# Patient Record
Sex: Female | Born: 1935 | Race: White | Hispanic: No | Marital: Married | Smoking: Never smoker
Health system: Southern US, Community
[De-identification: ages and names within clinical notes are randomized; demographics above are authoritative.]

## PROBLEM LIST (undated history)

## (undated) DIAGNOSIS — E559 Vitamin D deficiency, unspecified: Secondary | ICD-10-CM

## (undated) DIAGNOSIS — E7800 Pure hypercholesterolemia, unspecified: Secondary | ICD-10-CM

## (undated) DIAGNOSIS — Z794 Long term (current) use of insulin: Secondary | ICD-10-CM

## (undated) DIAGNOSIS — R011 Cardiac murmur, unspecified: Secondary | ICD-10-CM

## (undated) DIAGNOSIS — E785 Hyperlipidemia, unspecified: Secondary | ICD-10-CM

## (undated) DIAGNOSIS — Z8679 Personal history of other diseases of the circulatory system: Secondary | ICD-10-CM

## (undated) DIAGNOSIS — E119 Type 2 diabetes mellitus without complications: Secondary | ICD-10-CM

## (undated) DIAGNOSIS — E538 Deficiency of other specified B group vitamins: Secondary | ICD-10-CM

## (undated) DIAGNOSIS — I498 Other specified cardiac arrhythmias: Principal | ICD-10-CM

## (undated) DIAGNOSIS — I10 Essential (primary) hypertension: Secondary | ICD-10-CM

## (undated) HISTORY — DX: Vitamin D deficiency, unspecified: E55.9

## (undated) HISTORY — DX: Personal history of other diseases of the circulatory system: Z86.79

## (undated) HISTORY — DX: Deficiency of other specified B group vitamins: E53.8

## (undated) HISTORY — DX: Long term (current) use of insulin: Z79.4

## (undated) HISTORY — DX: Hyperlipidemia, unspecified: E78.5

## (undated) HISTORY — DX: Type 2 diabetes mellitus without complications: E11.9

## (undated) HISTORY — DX: Other specified cardiac arrhythmias: I49.8

## (undated) HISTORY — PX: ABLATION: SHX5711

---

## 1998-05-29 ENCOUNTER — Ambulatory Visit (HOSPITAL_COMMUNITY): Admission: RE | Admit: 1998-05-29 | Discharge: 1998-05-29 | Payer: Self-pay | Admitting: Obstetrics and Gynecology

## 1998-05-29 ENCOUNTER — Encounter: Payer: Self-pay | Admitting: Obstetrics and Gynecology

## 1998-08-18 ENCOUNTER — Other Ambulatory Visit: Admission: RE | Admit: 1998-08-18 | Discharge: 1998-08-18 | Payer: Self-pay | Admitting: Obstetrics and Gynecology

## 1999-06-22 ENCOUNTER — Ambulatory Visit (HOSPITAL_COMMUNITY): Admission: RE | Admit: 1999-06-22 | Discharge: 1999-06-22 | Payer: Self-pay | Admitting: Obstetrics and Gynecology

## 1999-06-22 ENCOUNTER — Encounter: Payer: Self-pay | Admitting: Obstetrics and Gynecology

## 1999-08-28 ENCOUNTER — Other Ambulatory Visit: Admission: RE | Admit: 1999-08-28 | Discharge: 1999-08-28 | Payer: Self-pay | Admitting: Obstetrics and Gynecology

## 2000-07-30 ENCOUNTER — Ambulatory Visit (HOSPITAL_COMMUNITY): Admission: RE | Admit: 2000-07-30 | Discharge: 2000-07-30 | Payer: Self-pay | Admitting: Obstetrics and Gynecology

## 2000-07-30 ENCOUNTER — Encounter: Payer: Self-pay | Admitting: Obstetrics and Gynecology

## 2000-08-04 ENCOUNTER — Encounter: Payer: Self-pay | Admitting: Obstetrics and Gynecology

## 2000-08-04 ENCOUNTER — Encounter: Admission: RE | Admit: 2000-08-04 | Discharge: 2000-08-04 | Payer: Self-pay | Admitting: Obstetrics and Gynecology

## 2000-09-01 ENCOUNTER — Other Ambulatory Visit: Admission: RE | Admit: 2000-09-01 | Discharge: 2000-09-01 | Payer: Self-pay | Admitting: Obstetrics and Gynecology

## 2001-09-21 ENCOUNTER — Other Ambulatory Visit: Admission: RE | Admit: 2001-09-21 | Discharge: 2001-09-21 | Payer: Self-pay | Admitting: Obstetrics and Gynecology

## 2001-09-24 ENCOUNTER — Ambulatory Visit (HOSPITAL_COMMUNITY): Admission: RE | Admit: 2001-09-24 | Discharge: 2001-09-24 | Payer: Self-pay | Admitting: Obstetrics and Gynecology

## 2001-09-24 ENCOUNTER — Encounter: Payer: Self-pay | Admitting: Obstetrics and Gynecology

## 2001-09-30 ENCOUNTER — Encounter: Payer: Self-pay | Admitting: Obstetrics and Gynecology

## 2001-09-30 ENCOUNTER — Encounter: Admission: RE | Admit: 2001-09-30 | Discharge: 2001-09-30 | Payer: Self-pay | Admitting: Obstetrics and Gynecology

## 2003-06-22 ENCOUNTER — Ambulatory Visit (HOSPITAL_COMMUNITY): Admission: RE | Admit: 2003-06-22 | Discharge: 2003-06-22 | Payer: Self-pay | Admitting: Obstetrics and Gynecology

## 2010-06-17 ENCOUNTER — Encounter: Payer: Self-pay | Admitting: Obstetrics and Gynecology

## 2013-03-26 LAB — VITAMIN D 25 HYDROXY (VIT D DEFICIENCY, FRACTURES): Vit D, 25-Hydroxy: 41.4

## 2013-03-26 LAB — BASIC METABOLIC PANEL
BUN: 19 mg/dL (ref 4–21)
CREATININE: 0.6 mg/dL (ref 0.5–1.1)
Potassium: 4.2 mmol/L (ref 3.4–5.3)
Sodium: 138 mmol/L (ref 137–147)

## 2013-03-26 LAB — HEPATIC FUNCTION PANEL
ALT: 14 U/L (ref 7–35)
AST: 17 U/L (ref 13–35)

## 2013-03-26 LAB — HEMOGLOBIN A1C: Hemoglobin A1C: 7.2

## 2014-01-18 ENCOUNTER — Emergency Department (HOSPITAL_BASED_OUTPATIENT_CLINIC_OR_DEPARTMENT_OTHER): Payer: Medicare Other

## 2014-01-18 ENCOUNTER — Emergency Department (HOSPITAL_BASED_OUTPATIENT_CLINIC_OR_DEPARTMENT_OTHER)
Admission: EM | Admit: 2014-01-18 | Discharge: 2014-01-18 | Disposition: A | Discharge status: Home / Self Care | Payer: Medicare Other | Attending: Emergency Medicine | Admitting: Emergency Medicine

## 2014-01-18 ENCOUNTER — Encounter (HOSPITAL_BASED_OUTPATIENT_CLINIC_OR_DEPARTMENT_OTHER): Payer: Self-pay | Admitting: Emergency Medicine

## 2014-01-18 DIAGNOSIS — I499 Cardiac arrhythmia, unspecified: Secondary | ICD-10-CM | POA: Insufficient documentation

## 2014-01-18 DIAGNOSIS — K5900 Constipation, unspecified: Secondary | ICD-10-CM | POA: Diagnosis not present

## 2014-01-18 DIAGNOSIS — E119 Type 2 diabetes mellitus without complications: Secondary | ICD-10-CM | POA: Insufficient documentation

## 2014-01-18 DIAGNOSIS — Z79899 Other long term (current) drug therapy: Secondary | ICD-10-CM | POA: Insufficient documentation

## 2014-01-18 DIAGNOSIS — I10 Essential (primary) hypertension: Secondary | ICD-10-CM | POA: Diagnosis not present

## 2014-01-18 DIAGNOSIS — E7800 Pure hypercholesterolemia, unspecified: Secondary | ICD-10-CM | POA: Diagnosis not present

## 2014-01-18 DIAGNOSIS — M549 Dorsalgia, unspecified: Secondary | ICD-10-CM | POA: Insufficient documentation

## 2014-01-18 DIAGNOSIS — R002 Palpitations: Secondary | ICD-10-CM | POA: Diagnosis not present

## 2014-01-18 DIAGNOSIS — R011 Cardiac murmur, unspecified: Secondary | ICD-10-CM | POA: Insufficient documentation

## 2014-01-18 HISTORY — DX: Cardiac murmur, unspecified: R01.1

## 2014-01-18 HISTORY — DX: Essential (primary) hypertension: I10

## 2014-01-18 HISTORY — DX: Type 2 diabetes mellitus without complications: E11.9

## 2014-01-18 HISTORY — DX: Pure hypercholesterolemia, unspecified: E78.00

## 2014-01-18 LAB — BASIC METABOLIC PANEL
Anion gap: 13 (ref 5–15)
BUN: 18 mg/dL (ref 6–23)
CALCIUM: 10.9 mg/dL — AB (ref 8.4–10.5)
CO2: 25 meq/L (ref 19–32)
Chloride: 101 mEq/L (ref 96–112)
Creatinine, Ser: 0.8 mg/dL (ref 0.50–1.10)
GFR calc Af Amer: 75 mL/min — ABNORMAL LOW (ref >90)
GFR, EST NON AFRICAN AMERICAN: 64 mL/min — AB (ref >90)
GLUCOSE: 228 mg/dL — AB (ref 70–99)
POTASSIUM: 4 meq/L (ref 3.7–5.3)
SODIUM: 139 meq/L (ref 137–147)

## 2014-01-18 LAB — CBC WITH DIFFERENTIAL/PLATELET
BASOS PCT: 0 % (ref 0–1)
Basophils Absolute: 0 10*3/uL (ref 0.0–0.1)
Eosinophils Absolute: 0.4 10*3/uL (ref 0.0–0.7)
Eosinophils Relative: 7 % — ABNORMAL HIGH (ref 0–5)
HCT: 38 % (ref 36.0–46.0)
HEMOGLOBIN: 13 g/dL (ref 12.0–15.0)
LYMPHS ABS: 1.2 10*3/uL (ref 0.7–4.0)
Lymphocytes Relative: 23 % (ref 12–46)
MCH: 31.6 pg (ref 26.0–34.0)
MCHC: 34.2 g/dL (ref 30.0–36.0)
MCV: 92.2 fL (ref 78.0–100.0)
MONO ABS: 0.4 10*3/uL (ref 0.1–1.0)
MONOS PCT: 8 % (ref 3–12)
Neutro Abs: 3.3 10*3/uL (ref 1.7–7.7)
Neutrophils Relative %: 62 % (ref 43–77)
PLATELETS: 154 10*3/uL (ref 150–400)
RBC: 4.12 MIL/uL (ref 3.87–5.11)
RDW: 12.3 % (ref 11.5–15.5)
WBC: 5.3 10*3/uL (ref 4.0–10.5)

## 2014-01-18 LAB — TROPONIN I

## 2014-01-18 NOTE — ED Notes (Signed)
Pt states hx at dinner was feeling her pulse and noticed a irregular heart beat. Pt denies pain or any other complaints.

## 2014-01-18 NOTE — ED Provider Notes (Signed)
CSN: [id number]     Arrival date & time 01/18/14  1920 History  This chart was scribed for Vanetta Mulders, MD by Roxy Cedar, ED Scribe. This patient was seen in room MH11/MH11 and the patient's care was started at 8:21 PM.   Chief Complaint  Patient presents with  . irregular heart beat    Patient is a 80 y.o. female presenting with palpitations. The history is provided by the patient. No language interpreter was used.  Palpitations Palpitations quality:  Irregular (heart murmurs) Chronicity:  New Context: anxiety   Context comment:  Stress Associated symptoms: back pain   Associated symptoms: no chest pain, no cough, no nausea, no shortness of breath and no vomiting     HPI Comments: Jennifer Serrano is a 80 y.o. female with a history of a heart murmur who presents to the Emergency Department complaining of an irregular heart beat and suspicion of irregularities. Patient is normally seen by her PCP in [place]. Patient states she noticed that her heart murmur "usually occur at least 1 minute apart, but for have recently come closer together than 1 minute". She states that she has been more stressed than usual while taking care of multiple family members. Patient denies associated chest pain, shortness of breath or palpitations.   Past Medical History  Diagnosis Date  . Heart murmur   . Diabetes mellitus without complication   . Hypertension   . High cholesterol    History reviewed. No pertinent past surgical history. No family history on file. History  Substance Use Topics  . Smoking status: Never Smoker   . Smokeless tobacco: Not on file  . Alcohol Use: No   OB History   Grav Para Term Preterm Abortions TAB SAB Ect Mult Living                 Review of Systems  Constitutional: Negative for fever and chills.  HENT: Negative for rhinorrhea and sore throat.   Eyes: Negative for visual disturbance.  Respiratory: Negative for cough and shortness of breath.    Cardiovascular: Positive for palpitations. Negative for chest pain and leg swelling.  Gastrointestinal: Positive for constipation. Negative for nausea, vomiting, abdominal pain and diarrhea.  Genitourinary: Negative for dysuria.  Musculoskeletal: Positive for back pain.  Skin: Negative for rash.  Neurological: Negative for headaches.  Hematological: Does not bruise/bleed easily.  Psychiatric/Behavioral: Negative for confusion.   Allergies  Sulfa antibiotics  Home Medications   Prior to Admission medications   Medication Sig Start Date End Date Taking? Authorizing Provider  amLODipine (NORVASC) 5 MG tablet Take 5 mg by mouth daily.   Yes Historical Provider, MD  glipiZIDE (GLUCOTROL) 10 MG tablet Take 10 mg by mouth daily before breakfast.   Yes Historical Provider, MD  metFORMIN (GLUCOPHAGE) 1000 MG tablet Take 1,000 mg by mouth 2 (two) times daily with a meal.   Yes Historical Provider, MD  metoprolol tartrate (LOPRESSOR) 25 MG tablet Take 25 mg by mouth 2 (two) times daily.   Yes Historical Provider, MD  rosuvastatin (CRESTOR) 5 MG tablet Take 5 mg by mouth daily.   Yes Historical Provider, MD   Triage Vitals: BP 171/80  Pulse 108  Temp(Src) 98.1 F (36.7 C) (Oral)  Resp 20  Ht  (1.626 m)  Wt 163 lb (73.936 kg)  BMI 27.97 kg/m2  SpO2 98% Physical Exam  Nursing note and vitals reviewed. Constitutional: She is oriented to person, place, and time. She appears well-developed and well-nourished.  HENT:  Mouth/Throat: Oropharynx is clear and moist.  Eyes: Conjunctivae and EOM are normal. Pupils are equal, round, and reactive to light. No scleral icterus.  Neck: Normal range of motion.  Cardiovascular: Normal rate, regular rhythm and normal heart sounds.   Pulmonary/Chest: Effort normal and breath sounds normal. She has no wheezes.  Abdominal: Soft. Bowel sounds are normal. There is no tenderness.  Musculoskeletal: Normal range of motion. She exhibits no edema.   Neurological: She is alert and oriented to person, place, and time. No cranial nerve deficit. She exhibits normal muscle tone. Coordination normal.    ED Course  Procedures (including critical care time)  DIAGNOSTIC STUDIES: Oxygen Saturation is 98% on RA, normal by my interpretation.    COORDINATION OF CARE: 8:26 PM- Discussed plans to order diagnostic lab work and imaging. Pt advised of plan for treatment and pt agrees.  Labs Review Labs Reviewed  CBC WITH DIFFERENTIAL - Abnormal; Notable for the following:    Eosinophils Relative 7 (*)    All other components within normal limits  BASIC METABOLIC PANEL - Abnormal; Notable for the following:    Glucose, Bld 228 (*)    Calcium 10.9 (*)    GFR calc non Af Amer 64 (*)    GFR calc Af Amer 75 (*)    All other components within normal limits  TROPONIN I   Results for orders placed during the hospital encounter of 01/18/14  CBC WITH DIFFERENTIAL      Result Value Ref Range   WBC 5.3  4.0 - 10.5 K/uL   RBC 4.12  3.87 - 5.11 MIL/uL   Hemoglobin 13.0  12.0 - 15.0 g/dL   HCT 40.9  81.1 - 91.4 %   MCV 92.2  78.0 - 100.0 fL   MCH 31.6  26.0 - 34.0 pg   MCHC 34.2  30.0 - 36.0 g/dL   RDW 78.2  95.6 - 21.3 %   Platelets 154  150 - 400 K/uL   Neutrophils Relative % 62  43 - 77 %   Lymphocytes Relative 23  12 - 46 %   Monocytes Relative 8  3 - 12 %   Eosinophils Relative 7 (*) 0 - 5 %   Basophils Relative 0  0 - 1 %   Neutro Abs 3.3  1.7 - 7.7 K/uL   Lymphs Abs 1.2  0.7 - 4.0 K/uL   Monocytes Absolute 0.4  0.1 - 1.0 K/uL   Eosinophils Absolute 0.4  0.0 - 0.7 K/uL   Basophils Absolute 0.0  0.0 - 0.1 K/uL  BASIC METABOLIC PANEL      Result Value Ref Range   Sodium 139  137 - 147 mEq/L   Potassium 4.0  3.7 - 5.3 mEq/L   Chloride 101  96 - 112 mEq/L   CO2 25  19 - 32 mEq/L   Glucose, Bld 228 (*) 70 - 99 mg/dL   BUN 18  6 - 23 mg/dL   Creatinine, Ser 0.86  0.50 - 1.10 mg/dL   Calcium 57.8 (*) 8.4 - 10.5 mg/dL   GFR calc non Af  Amer 64 (*) >90 mL/min   GFR calc Af Amer 75 (*) >90 mL/min   Anion gap 13  5 - 15  TROPONIN I      Result Value Ref Range   Troponin I <0.30  <0.30 ng/mL   Dg Chest 2 View  01/18/2014   CLINICAL DATA:  Palpitations at about 6 p.m. tonight.  EXAM: CHEST  2 VIEW  COMPARISON:  None.  FINDINGS: Pulmonary hyperinflation. The heart size and mediastinal contours are within normal limits. Both lungs are clear. The visualized skeletal structures are unremarkable. Calcification of aorta.  IMPRESSION: No active cardiopulmonary disease.   Electronically Signed   By: Burman Nieves M.D.   On: 01/18/2014 21:20     Imaging Review Dg Chest 2 View  01/18/2014   CLINICAL DATA:  Palpitations at about 6 p.m. tonight.  EXAM: CHEST  2 VIEW  COMPARISON:  None.  FINDINGS: Pulmonary hyperinflation. The heart size and mediastinal contours are within normal limits. Both lungs are clear. The visualized skeletal structures are unremarkable. Calcification of aorta.  IMPRESSION: No active cardiopulmonary disease.   Electronically Signed   By: Burman Nieves M.D.   On: 01/18/2014 21:20     EKG Interpretation   Date/Time:  Tuesday January 18 2014 19:56:46 EDT Ventricular Rate:  94 PR Interval:  174 QRS Duration: 84 QT Interval:  342 QTC Calculation: 427 R Axis:   53 Text Interpretation:  Sinus rhythm with Premature atrial complexes  Nonspecific ST and T wave abnormality Abnormal ECG No previous ECGs  available Confirmed by Roselyn Doby  MD, [place] [phone]) on 01/18/2014 8:17:44  PM      MDM   Final diagnoses:  Heart palpitations    The patient noted an irregular heartbeat escaping of the beat by checking her pulse. Did not have any sensation of palpitations no chest pain no shortness of breath. Workup without any specific findings. EKG did show some premature atrial contractions no abnormal ventricular beats. Troponin negative chest x-ray negative for pneumonia pulmonary edema or pneumothorax. No significant  which are light abnormalities. Patient blood sugar was elevated she has a history of diabetes. No significant anemia or leukocytosis.  I personally performed the services described in this documentation, which was scribed in my presence. The recorded information has been reviewed and is accurate.      Vanetta Mulders, MD 01/18/14 2129

## 2014-01-18 NOTE — ED Notes (Signed)
Patient transported to X-ray via stretcher per tech. 

## 2014-01-18 NOTE — Discharge Instructions (Signed)
Workup here tonight without any significant abnormalities. Return for any newer worse symptoms.

## 2015-09-26 LAB — LIPID PANEL
Cholesterol: 127 mg/dL (ref 0–200)
HDL: 46 mg/dL (ref 35–70)
LDL Cholesterol: 57 mg/dL
TRIGLYCERIDES: 123 mg/dL (ref 40–160)

## 2015-09-26 LAB — HEMOGLOBIN A1C: Hemoglobin A1C: 6.9

## 2015-09-26 LAB — BASIC METABOLIC PANEL
BUN: 21 mg/dL (ref 4–21)
CREATININE: 0.7 mg/dL (ref 0.5–1.1)
POTASSIUM: 4.6 mmol/L (ref 3.4–5.3)
Sodium: 139 mmol/L (ref 137–147)

## 2015-09-26 LAB — HEPATIC FUNCTION PANEL
ALT: 14 U/L (ref 7–35)
AST: 22 U/L (ref 13–35)

## 2016-03-25 IMAGING — CR DG CHEST 2V
2 series · 2 of 2 positions shown · non-contrast
Comparison: None.

CLINICAL DATA: Palpitations at about 6 p.m. tonight.

EXAM:
CHEST  2 VIEW

[w chest pa]
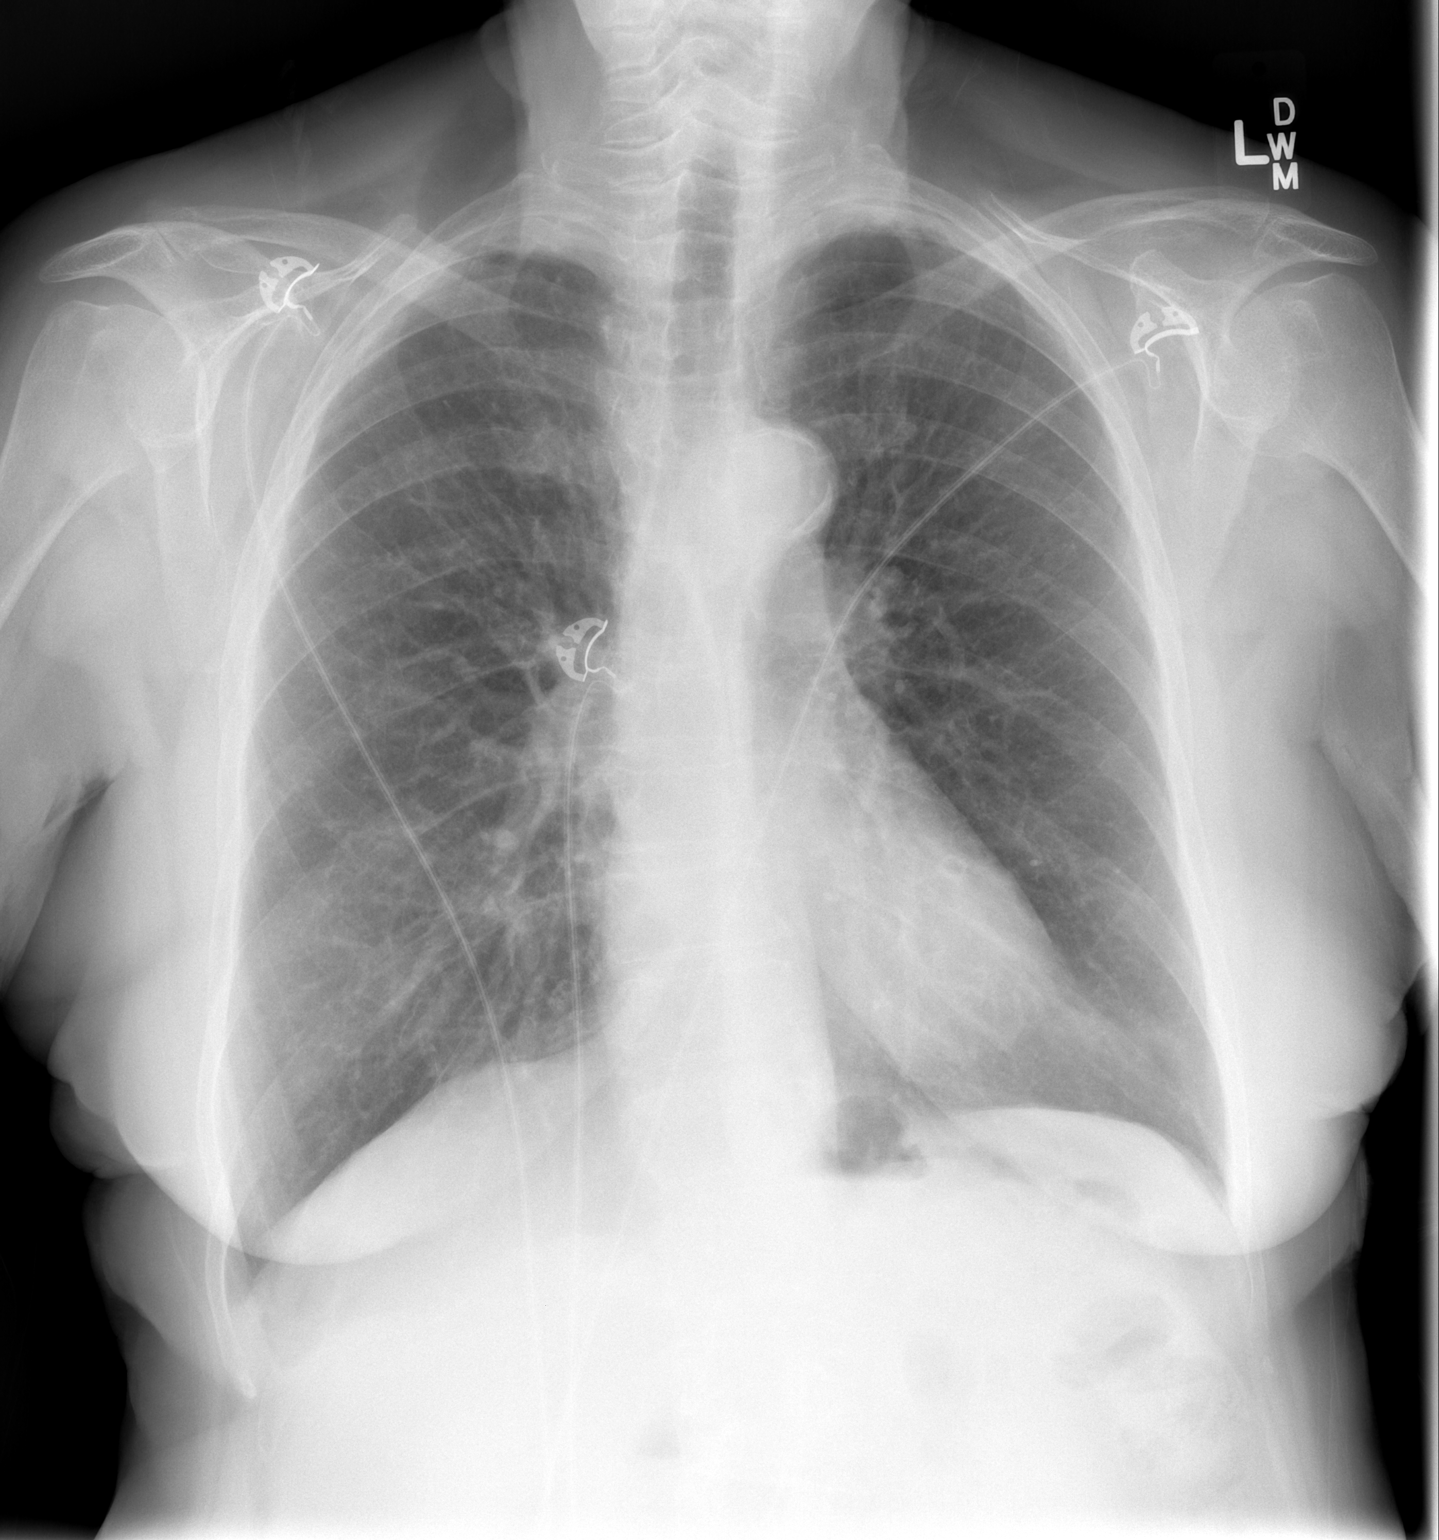

[w chest lat]
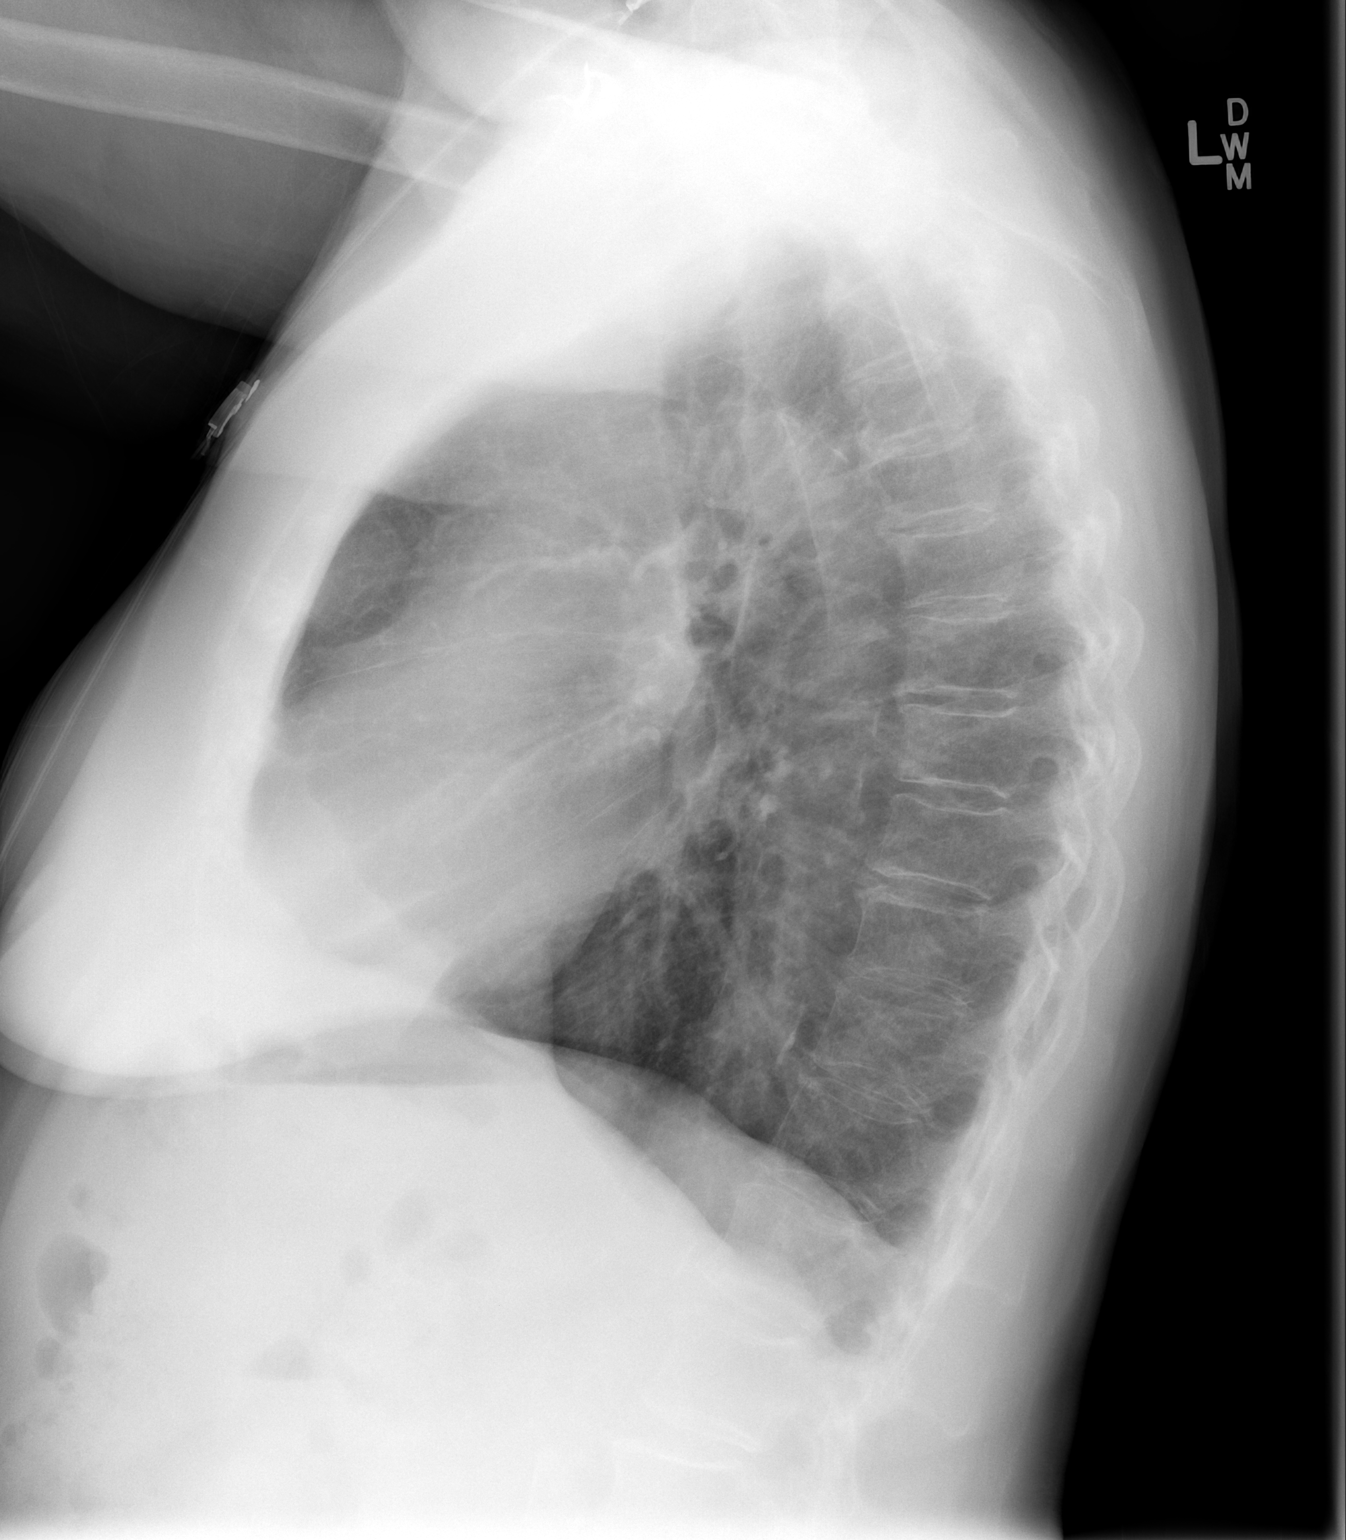

[2 of 2 positions shown; findings below may reference images not displayed]

FINDINGS: Pulmonary hyperinflation. The heart size and mediastinal contours
are within normal limits. Both lungs are clear. The visualized
skeletal structures are unremarkable. Calcification of aorta.
IMPRESSION: No active cardiopulmonary disease.

## 2016-06-10 LAB — LIPID PANEL
Cholesterol: 145 mg/dL (ref 0–200)
HDL: 54 mg/dL (ref 35–70)
LDL CALC: 68 mg/dL
TRIGLYCERIDES: 113 mg/dL (ref 40–160)

## 2016-06-10 LAB — TSH: TSH: 3.8 u[IU]/mL (ref 0.41–5.90)

## 2016-08-12 LAB — CBC AND DIFFERENTIAL
HCT: 40 % (ref 36–46)
HEMOGLOBIN: 13.5 g/dL (ref 12.0–16.0)
PLATELETS: 208 10*3/uL (ref 150–399)
WBC: 10.4 10^3/mL

## 2016-08-12 LAB — BASIC METABOLIC PANEL
BUN: 21 mg/dL (ref 4–21)
Creatinine: 1 mg/dL (ref 0.5–1.1)

## 2016-08-12 LAB — HEPATIC FUNCTION PANEL
ALT: 39 U/L — AB (ref 7–35)
AST: 36 U/L — AB (ref 13–35)

## 2016-10-08 ENCOUNTER — Telehealth: Payer: Self-pay | Admitting: Behavioral Health

## 2016-10-08 NOTE — Telephone Encounter (Signed)
Unable to reach patient/caregiver at time of Pre-Visit Call. Left message for patient/caregiver to return call when available.

## 2016-10-09 ENCOUNTER — Ambulatory Visit (INDEPENDENT_AMBULATORY_CARE_PROVIDER_SITE_OTHER): Payer: Medicare Other | Admitting: Family Medicine

## 2016-10-09 ENCOUNTER — Telehealth: Payer: Self-pay | Admitting: Family Medicine

## 2016-10-09 ENCOUNTER — Encounter: Payer: Self-pay | Admitting: Family Medicine

## 2016-10-09 VITALS — BP 110/72 | HR 76 | Temp 98.0°F | Ht 64.0 in | Wt 171.2 lb

## 2016-10-09 DIAGNOSIS — Z794 Long term (current) use of insulin: Secondary | ICD-10-CM

## 2016-10-09 DIAGNOSIS — Z5181 Encounter for therapeutic drug level monitoring: Secondary | ICD-10-CM | POA: Diagnosis not present

## 2016-10-09 DIAGNOSIS — E119 Type 2 diabetes mellitus without complications: Secondary | ICD-10-CM | POA: Diagnosis not present

## 2016-10-09 DIAGNOSIS — Z8679 Personal history of other diseases of the circulatory system: Secondary | ICD-10-CM

## 2016-10-09 DIAGNOSIS — R413 Other amnesia: Secondary | ICD-10-CM

## 2016-10-09 HISTORY — DX: Personal history of other diseases of the circulatory system: Z86.79

## 2016-10-09 HISTORY — DX: Type 2 diabetes mellitus without complications: E11.9

## 2016-10-09 LAB — COMPREHENSIVE METABOLIC PANEL
ALBUMIN: 4 g/dL (ref 3.5–5.2)
ALK PHOS: 56 U/L (ref 39–117)
ALT: 45 U/L — ABNORMAL HIGH (ref 0–35)
AST: 30 U/L (ref 0–37)
BILIRUBIN TOTAL: 0.4 mg/dL (ref 0.2–1.2)
BUN: 23 mg/dL (ref 6–23)
CO2: 30 mEq/L (ref 19–32)
Calcium: 10.7 mg/dL — ABNORMAL HIGH (ref 8.4–10.5)
Chloride: 106 mEq/L (ref 96–112)
Creatinine, Ser: 1.04 mg/dL (ref 0.40–1.20)
GFR: 52.85 mL/min — AB (ref >60.00)
Glucose, Bld: 225 mg/dL — ABNORMAL HIGH (ref 70–99)
Potassium: 5.1 mEq/L (ref 3.5–5.1)
Sodium: 139 mEq/L (ref 135–145)
TOTAL PROTEIN: 7 g/dL (ref 6.0–8.3)

## 2016-10-09 LAB — CBC
HEMATOCRIT: 37.5 % (ref 36.0–46.0)
Hemoglobin: 12.5 g/dL (ref 12.0–15.0)
MCHC: 33.5 g/dL (ref 30.0–36.0)
MCV: 94.3 fl (ref 78.0–100.0)
PLATELETS: 224 10*3/uL (ref 150.0–400.0)
RBC: 3.97 Mil/uL (ref 3.87–5.11)
RDW: 14.1 % (ref 11.5–15.5)
WBC: 4.9 10*3/uL (ref 4.0–10.5)

## 2016-10-09 LAB — HEMOGLOBIN A1C: Hgb A1c MFr Bld: 8.7 % — ABNORMAL HIGH (ref 4.6–6.5)

## 2016-10-09 NOTE — Progress Notes (Addendum)
[facility] [place] [address], [address] [place], [place] [address] [phone] [phone]  Date:  10/09/2016   Name:  Jennifer Serrano   DOB:  07-17-1923   MRN:  [id number]  PCP:  Pearline Cablesopland, Margert Edsall C, MD    Chief Complaint: Establish Care (Pt here to est care. )   History of Present Illness:  Jennifer Serrano is a 83 y.o. very pleasant female patient who presents with the following:  Here today as a new patient with her daughter  She recently moved back to this area from [place]   She needs to re-establish care with a local physicial  She has a history of DM and HTN.   She is on lantus 10 BID and a sliding scale of humalog with meals.  Her most recent A1c was about 6.5 per her recollection.   SSI as follows 150 -200- 2u 201- 249  -4u 250- 300- 6u 301-350- 8u Over 350- 10u She is living with her daughter and SIL- she plans to visit her other family in [place] for about a one month coming up soon.   She did secretarial work as a younger person . She has 2 daughters, Jennifer HarryShelly (who is here today) and Jennifer [place]aganiniudrey in [place]- she has 4 grandchildren.   She is originally from [place].    Her husband passed away in 2005.   She has a history of atrial fib first noted in October of 17 - she had an ablation done last fall.  She is on eliquis now.   She is not aware of any palpitations and seems to be in sinus rhythm at this time.    Her daughter notes some issues with her short term memory. She confides that the long term plan will be to [facility] [place] [facility] a skilled facility they have an FL2 form for me to complete today They bring in a document listing reasons she needs skilled care including  - trouble with following diabetic diet and using her insulin, leaving her medications behind, losing things. Bladder incont, needs a cane or walker, unable to bathe unassisted Patient Active Problem List   Diagnosis Date Noted  . Controlled type 2 diabetes mellitus  without complication, with long-term current use of insulin (HCC) 10/09/2016  . History of atrial fibrillation 10/09/2016    Past Medical History:  Diagnosis Date  . Diabetes mellitus without complication (HCC)   . Heart murmur   . High cholesterol   . Hypertension     Past Surgical History:  Procedure Laterality Date  . ABLATION      Social History  Substance Use Topics  . Smoking status: Never Smoker  . Smokeless tobacco: Not on file  . Alcohol use No    Family History  Problem Relation Age of Onset  . Breast cancer Other     Allergies  Allergen Reactions  . Sulfa Antibiotics     Medication list has been reviewed and updated.  Current Outpatient Prescriptions on File Prior to Visit  Medication Sig Dispense Refill  . glipiZIDE (GLUCOTROL) 10 MG tablet Take 10 mg by mouth 2 (two) times daily before a meal.     . metoprolol tartrate (LOPRESSOR) 25 MG tablet Take 25 mg by mouth 2 (two) times daily.    . rosuvastatin (CRESTOR) 5 MG tablet Take 5 mg by mouth daily.     No current facility-administered medications on file prior to visit.  Review of Systems:  As per HPI- otherwise negative. No fever, chills, CP, SOB, nausea, vomiting or rash   Physical Examination: Vitals:   10/09/16 1330  BP: 110/72  Pulse: 76  Temp: 98 F (36.7 C)   Vitals:   10/09/16 1330  Weight: 171 lb 3.2 oz (77.7 kg)  Height: 5\' 4"  (1.626 m)   Body mass index is 29.39 kg/m. Ideal Body Weight: Weight in (lb) to have BMI = 25: 145.3  GEN: WDWN, NAD, Non-toxic, A & O x 3, looks well and in good health. Here today with her daughter HEENT: Atraumatic, Normocephalic. Neck supple. No masses, No LAD. Ears and Nose: No external deformity. CV: RRR- sinus, No M/G/R. No JVD. No thrill. No extra heart sounds. PULM: CTA B, no wheezes, crackles, rhonchi. No retractions. No resp. distress. No accessory muscle use. ABD: S, NT, ND, +BS. No rebound. No HSM. EXTR: No c/c/e NEURO Normal gait.   PSYCH: Normally interactive. Conversant. Not depressed or anxious appearing.  Calm demeanor.    Assessment and Plan: Controlled type 2 diabetes mellitus without complication, with long-term current use of insulin (HCC) - Plan: Hemoglobin A1c, Comprehensive metabolic panel  Medication monitoring encounter - Plan: CBC, Hemoglobin A1c, Comprehensive metabolic panel  History of atrial fibrillation  Here today to establish care.  recently moved from [place] to live with her daughter in [place] She is on a fairly complex insulin regimen requiring 4-5 shots a day.  Will try to simplify this for her depending on her A1c today Also will check her CBC and CMP She has a history of a fib s/p ablation and is now in [place] When she returns from her upcoming extended trip will set her up with a local cardiologist.   Have requested records from [place] for her today and will review   Signed Abbe Amsterdam, MD  Received her labs and called to speak to [facility] Her A1c is a bit higher than we would like but given her age this is not urgen.  Would like to simplify her insulin regimen- change to lantus 20 once a day, stop SSI, and increase her lantus to compensate. However as she is about to visit family in [place] for a month will defer and do when she gets home for ease  Also noted high Ca- needs a PTH level. They will bring her in for labs only this week Ordered PTH level  Results for orders placed or performed in visit on 10/09/16  CBC  Result Value Ref Range   WBC 4.9 4.0 - 10.5 K/uL   RBC 3.97 3.87 - 5.11 Mil/uL   Platelets 224.0 150.0 - 400.0 K/uL   Hemoglobin 12.5 12.0 - 15.0 g/dL   HCT 13.2 44.0 - 10.2 %   MCV 94.3 78.0 - 100.0 fl   MCHC 33.5 30.0 - 36.0 g/dL   RDW 72.5 36.6 - 44.0 %  Hemoglobin A1c  Result Value Ref Range   Hgb A1c MFr Bld 8.7 (H) 4.6 - 6.5 %  Comprehensive metabolic panel  Result Value Ref Range   Sodium 139 135 - 145 mEq/L   Potassium 5.1 3.5 - 5.1 mEq/L    Chloride 106 96 - 112 mEq/L   CO2 30 19 - 32 mEq/L   Glucose, Bld 225 (H) 70 - 99 mg/dL   BUN 23 6 - 23 mg/dL   Creatinine, Ser 3.47 0.40 - 1.20 mg/dL   Total Bilirubin 0.4 0.2 - 1.2 mg/dL   Alkaline Phosphatase 56  39 - 117 U/L   AST 30 0 - 37 U/L   ALT 45 (H) 0 - 35 U/L   Total Protein 7.0 6.0 - 8.3 g/dL   Albumin 4.0 3.5 - 5.2 g/dL   Calcium 16.1 (H) 8.4 - 10.5 mg/dL   GFR 09.60 (L) >45.40 mL/min

## 2016-10-09 NOTE — Telephone Encounter (Signed)
Caller name: Molly MaduroRobert  Relation to pt: son-in-law Call back number: Jennifer Serrano's tel [phone] (daughter) Pharmacy:  Reason for call: Molly MaduroRobert came in office stating that pt is being seen today and wanted to give Provider some info before pts appt and had some details that wanted to inform the provider before pt's appt. Molly MaduroRobert stated would like to speak with provider before appt today, to please call Jennifer Serrano daughter at tel mentioned above.  Robert (son-in-law) left at front office some documents (FL2 form) and detailed info on medical issues. (Document [facility] to [facility]). Please advise ASAP.

## 2016-10-09 NOTE — Patient Instructions (Signed)
It was very nice to see you today- we will get your A1c and I will be in touch with you pending your labs.  I suspect we will be able to simplify your diabetes regimen and get you just on lantus once a day.

## 2016-10-13 ENCOUNTER — Encounter: Payer: Self-pay | Admitting: Family Medicine

## 2016-10-13 NOTE — Addendum Note (Signed)
Addended by: Abbe AmsterdamOPLAND, Krystyl Cannell C on: 10/13/2016 04:37 PM   Modules accepted: Orders

## 2016-10-18 NOTE — Telephone Encounter (Signed)
Called and answered their questions about FL-2 form.  I have mailed it to them

## 2016-10-18 NOTE — Telephone Encounter (Signed)
Caller name: Reece LevyShepard,Michelle Call back number: [phone]    Reason for call:  Daughter would like discuss message below, please advise

## 2016-10-20 ENCOUNTER — Telehealth: Payer: Self-pay | Admitting: Family Medicine

## 2016-10-20 DIAGNOSIS — E785 Hyperlipidemia, unspecified: Secondary | ICD-10-CM

## 2016-10-20 DIAGNOSIS — E538 Deficiency of other specified B group vitamins: Secondary | ICD-10-CM

## 2016-10-20 DIAGNOSIS — E559 Vitamin D deficiency, unspecified: Secondary | ICD-10-CM | POA: Insufficient documentation

## 2016-10-20 HISTORY — DX: Hyperlipidemia, unspecified: E78.5

## 2016-10-20 HISTORY — DX: Vitamin D deficiency, unspecified: E55.9

## 2016-10-20 HISTORY — DX: Deficiency of other specified B group vitamins: E53.8

## 2016-10-20 NOTE — Telephone Encounter (Signed)
Received outside records from Dr. Franchot GalloBurnsten in [place].  Will abstract/ scan as appropriate

## 2016-10-24 ENCOUNTER — Encounter: Payer: Self-pay | Admitting: Family Medicine

## 2016-11-05 ENCOUNTER — Telehealth: Payer: Self-pay | Admitting: Family Medicine

## 2016-11-05 NOTE — Telephone Encounter (Signed)
°  Relation to pt: self   Call back number: [phone] [facility]  [facility] [address] [place], [place] [id number]  [phone]  Reason for call:  Patient requesting a refill "eliquis" chart doesn't reflect, please advise

## 2016-11-06 ENCOUNTER — Other Ambulatory Visit: Payer: Self-pay | Admitting: Emergency Medicine

## 2016-11-07 NOTE — Telephone Encounter (Signed)
Called Scott- no answer. LMOM- are we sure she is taking eliquis? No on her med list. Will try him back

## 2016-11-07 NOTE — Telephone Encounter (Signed)
Pt nephew Mr Lorin PicketScott called requesting update on refill request for Eli[facility] to [facility] [place]. Call him at [phone].

## 2016-11-08 MED ORDER — APIXABAN 5 MG PO TABS: 5.0000 mg | ORAL_TABLET | Freq: Two times a day (BID) | ORAL | 3 refills | Status: AC

## 2016-11-08 NOTE — Telephone Encounter (Signed)
Caller name: Jennifer Serrano Relationship to patient: Daughter Can be reached: [phone] or [phone] [facility]:  [id number] [place] - [address] [facility] [address] [phone] (Phone) [phone] (Fax)   Reason for call: Daughter Jennifer Duster(Michelle) request a call from provider to discuss Eliquis.

## 2016-11-08 NOTE — Telephone Encounter (Signed)
Called and was able to [facility]- per her report her mother has been on Eliquis since the fall- they just forgot to report this med at our last visit  [place] [facility] [place] [facility] [place]  Called them to find out dosage - she has been on 5mg  BID Will rx to the pharmacy in [place] for them  [facility] that rx is sent in

## 2016-11-16 ENCOUNTER — Telehealth: Payer: Self-pay | Admitting: Family Medicine

## 2016-11-16 DIAGNOSIS — I498 Other specified cardiac arrhythmias: Secondary | ICD-10-CM

## 2016-11-16 HISTORY — DX: Other specified cardiac arrhythmias: I49.8

## 2016-11-16 NOTE — Telephone Encounter (Signed)
Received outside [facility] for [facility] and will abstract/ scan as needed

## 2016-11-27 ENCOUNTER — Encounter: Payer: Self-pay | Admitting: Cardiology

## 2016-11-28 ENCOUNTER — Telehealth: Payer: Self-pay | Admitting: Family Medicine

## 2016-11-28 MED ORDER — GLIPIZIDE 10 MG PO TABS: 10.0000 mg | ORAL_TABLET | Freq: Two times a day (BID) | ORAL | 3 refills | Status: AC

## 2016-11-28 NOTE — Telephone Encounter (Signed)
Daughter Marcelino DusterMichelle called on (DPR) need refill on glipiZIDE (GLUCOTROL) 10 MG tablet [facility]  [place] to get [facility] [address]. Daughter stated you done this for other medication and she needs it done for one too.   [phone]

## 2016-12-02 ENCOUNTER — Other Ambulatory Visit (INDEPENDENT_AMBULATORY_CARE_PROVIDER_SITE_OTHER): Payer: Medicare Other

## 2016-12-03 LAB — PTH, INTACT AND CALCIUM
CALCIUM: 10 mg/dL (ref 8.6–10.4)
PTH: 58 pg/mL (ref 14–64)

## 2016-12-04 ENCOUNTER — Encounter: Payer: Self-pay | Admitting: Family Medicine

## 2016-12-09 NOTE — Progress Notes (Signed)
[facility] [address] [place], [place] [address] [phone]  Date:  12/11/2016   Name:  Jennifer Serrano   DOB:  September 09, 1923   MRN:  [id number]  PCP:  Darreld Mclean, MD    Chief Complaint: Blood Pressure Check (Fluctuatiing) and Medication Problem (Possible recall Valsartan)   History of Present Illness:  Jennifer Serrano is a 83 y.o. very pleasant female patient who presents with the following:  Here today for a follow-up visit I met this pt about 2 months ago, and she has been away visiting other family over the last month or so:  Here today as a new patient with her daughter  She recently moved back to this area from [place].   She needs to re-establish care with a local physicial  She has a history of DM and HTN.   She is on lantus 10 BID and a sliding scale of humalog with meals.  Her most recent A1c was about 6.5 per her recollection.   SSI as follows 150 -200- 2u 201- 249  -4u 250- 300- 6u 301-350- 8u Over 350- 10u She is living with her daughter and SIL- she plans to visit her other family in [place] for about a one month coming up soon.   She did secretarial work as a younger person . She has 2 daughters, Jennifer Serrano (who is here today) and Jennifer Serrano in [place]- she has 4 grandchildren.   She is originally from [place].    Her husband passed away in 2003/08/28.   She has a history of atrial fib first noted in October of 17 - she had an ablation done last fall.  She is on eliquis now.   She is not aware of any palpitations and seems to be in sinus rhythm at this time.    Her daughter notes some issues with her short term memory. She confides that the long term plan will be to place Si[facility] in a skilled facility they have an [place] form for me to complete today They bring in a document listing reasons she needs skilled care including  - trouble with following diabetic diet and using her insulin, leaving her  medications behind, losing things. Bladder incont, needs a cane or walker, unable to bathe unassisted  Lab Results  Component Value Date   HGBA1C 8.7 (H) 10/09/2016   Her calcium was a bit high at first visit- however recheck Ca and PTH were normal We would like to try and simplify her insulin regimen if we can  She recently went to [place] to visit family. Now back at her home base She is feeling well She has gained a little weight recently  She generally sleeps about 8 hours and is a good sleeper.  She is on crestor, eliquis Right now she is taking lantus 20 daily; 10 and 10 pm She feels like she uses 4 units of humalog 3x daily = 12 units daily.  She will check her sugar TID and give a SSI dose if >150 She is also taking glipizide BID  Her valsartan has been recalled due to contamination at the manufacturer- will change to losartan.    She has a history of a fib s/p ablation.  Does not have a local cardiologist and would like to establish with same  Wt Readings from Last 3 Encounters:  12/11/16 175 lb (79.4 kg)  10/09/16 171 lb 3.2 oz (  77.7 kg)  01/18/14 163 lb (73.9 kg)    Patient Active Problem List   Diagnosis Date Noted  . Atrial arrhythmia 11/16/2016  . Vitamin D deficiency 10/20/2016  . B12 deficiency 10/20/2016  . Dyslipidemia 10/20/2016  . Controlled type 2 diabetes mellitus without complication, with long-term current use of insulin ([place]) 10/09/2016  . History of atrial fibrillation 10/09/2016    Past Medical History:  Diagnosis Date  . Diabetes mellitus without complication ([place])   . Heart murmur   . High cholesterol   . Hypertension     Past Surgical History:  Procedure Laterality Date  . ABLATION      Social History  Substance Use Topics  . Smoking status: Never Smoker  . Smokeless tobacco: Not on file  . Alcohol use No    Family History  Problem Relation Age of Onset  . Breast cancer Other     Allergies  Allergen  Reactions  . Sulfa Antibiotics     Medication list has been reviewed and updated.  Current Outpatient Prescriptions on File Prior to Visit  Medication Sig Dispense Refill  . apixaban (ELIQUIS) 5 MG TABS tablet Take 1 tablet (5 mg total) by mouth 2 (two) times daily. 60 tablet 3  . docusate sodium (COLACE) 100 MG capsule Take 100 mg by mouth 2 (two) times daily.    Marland Kitchen glipiZIDE (GLUCOTROL) 10 MG tablet Take 1 tablet (10 mg total) by mouth 2 (two) times daily before a meal. 180 tablet 3  . Insulin Glargine (LANTUS SOLOSTAR) 100 UNIT/ML Solostar Pen Inject 10 Units into the skin. Inject 10 units subcutaneously in the morning and in the evening.    . insulin lispro (HUMALOG KWIKPEN) 100 UNIT/ML KiwkPen Inject into the skin. Adjust according to sliding scale before each meal.    . Magnesium 400 MG TABS Take 1 tablet by mouth daily.    . metoprolol tartrate (LOPRESSOR) 25 MG tablet Take 25 mg by mouth 2 (two) times daily.    . Multiple Vitamins-Minerals (MULTI-VITAMIN GUMMIES PO) Take 2 tablets by mouth daily.    . rosuvastatin (CRESTOR) 5 MG tablet Take 5 mg by mouth daily.    . valsartan (DIOVAN) 80 MG tablet Take 80 mg by mouth 2 (two) times daily.     No current facility-administered medications on file prior to visit.     Review of Systems:  As per HPI- otherwise negative.   Physical Examination: Vitals:   12/11/16 1537  BP: 135/81  Pulse: 81  Temp: 97.9 F (36.6 C)   Vitals:   12/11/16 1537  Weight: 175 lb (79.4 kg)  Height: '5\' 4"'$  (1.626 m)   Body mass index is 30.04 kg/m. Ideal Body Weight: Weight in (lb) to have BMI = 25: 145.3  GEN: WDWN, NAD, Non-toxic, A & O x 3, older lady here with her daughter Jennifer Serrano who helps to give the history today HEENT: Atraumatic, Normocephalic. Neck supple. No masses, No LAD. Ears and Nose: No external deformity. CV: RRR, No M/G/R. No JVD. No thrill. No extra heart sounds. PULM: CTA B, no wheezes, crackles, rhonchi. No retractions. No  resp. distress. No accessory muscle use. EXTR: No c/c/e NEURO Normal gait.  PSYCH: Normally interactive. Conversant. Not depressed or anxious appearing.  Calm demeanor.  She does show some signs of dementia and forgetfulness with more than very superficial conversation    Assessment and Plan: Controlled type 2 diabetes mellitus without complication, with long-term current use of insulin ([place])  History  of atrial fibrillation - Plan: Ambulatory referral to Cardiology  Memory loss  Essential hypertension - Plan: losartan (COZAAR) 50 MG tablet  Here today for a couple of reasons Repeat calcium/ PTH normal Change from valsartan to losartan due to product recall She needs to establish with a cardiologist will place referral now Made detailed plan for her insulin regimen:  Please skip lantus tonight Then tomorrow morning give 20 units of lantus all at once After a few days, increase the lantus to 22 units.  If blood sugars are still running higher than 150- 180 increase to 24 units.  Continue to use your mealtime insulin (humalog), but don't give any unless your sugar is over 180  If you start having sugars under 100 decrease your lantus by 5 and let me know!   My hope is that we can get you off the mealtime insulin entirely and just use lantus once a day  Signed Lamar Blinks, MD

## 2016-12-11 ENCOUNTER — Ambulatory Visit (INDEPENDENT_AMBULATORY_CARE_PROVIDER_SITE_OTHER): Payer: Medicare Other | Admitting: Family Medicine

## 2016-12-11 VITALS — BP 135/81 | HR 81 | Temp 97.9°F | Ht 64.0 in | Wt 175.0 lb

## 2016-12-11 DIAGNOSIS — I10 Essential (primary) hypertension: Secondary | ICD-10-CM

## 2016-12-11 DIAGNOSIS — Z8679 Personal history of other diseases of the circulatory system: Secondary | ICD-10-CM | POA: Diagnosis not present

## 2016-12-11 DIAGNOSIS — R413 Other amnesia: Secondary | ICD-10-CM

## 2016-12-11 DIAGNOSIS — Z794 Long term (current) use of insulin: Secondary | ICD-10-CM | POA: Diagnosis not present

## 2016-12-11 DIAGNOSIS — E119 Type 2 diabetes mellitus without complications: Secondary | ICD-10-CM | POA: Diagnosis not present

## 2016-12-11 MED ORDER — LOSARTAN POTASSIUM 50 MG PO TABS: 50.0000 mg | ORAL_TABLET | Freq: Every day | ORAL | 3 refills | Status: AC

## 2016-12-11 NOTE — Patient Instructions (Addendum)
Please skip lantus tonight Then tomorrow morning give 20 units of lantus all at once After a few days, increase the lantus to 22 units.  If blood sugars are still running higher than 150- 180 increase to 24 units.  Continue to use your mealtime insulin (humalog), but don't give any unless your sugar is over 180  If you start having sugars under 100 decrease your lantus by 5 and let me know!   My hope is that we can get you off the mealtime insulin entirely and just use lantus once a day  Please come in for an A1c test in about 2 months I am going to refer you to cardiology We will change your valsartan to losartan due to contamination at the manufacturing level

## 2016-12-12 ENCOUNTER — Telehealth: Payer: Self-pay

## 2016-12-12 NOTE — Telephone Encounter (Signed)
Daughter called in with questions regarding Losartan and patients Sliding Scale Insulin directions. After reviewing with Dr. Patsy Lageropland, patient to take Losartan 50 mg 1 daily and as far as sliding scale is concerned patient to receive 2 Units Lantus if her Blood Sugar level reads 180-200. Advised to disregard the 1501-200 listed per Dr. Patsy Lageropland. Daughter states she understood now. Advised her to call back with any questions.

## 2016-12-13 ENCOUNTER — Ambulatory Visit: Payer: Medicare Other | Admitting: Cardiology

## 2016-12-16 ENCOUNTER — Ambulatory Visit (INDEPENDENT_AMBULATORY_CARE_PROVIDER_SITE_OTHER): Payer: Medicare Other | Admitting: Cardiology

## 2016-12-16 DIAGNOSIS — Z794 Long term (current) use of insulin: Secondary | ICD-10-CM | POA: Diagnosis not present

## 2016-12-16 DIAGNOSIS — I498 Other specified cardiac arrhythmias: Secondary | ICD-10-CM | POA: Diagnosis not present

## 2016-12-16 DIAGNOSIS — E785 Hyperlipidemia, unspecified: Secondary | ICD-10-CM | POA: Diagnosis not present

## 2016-12-16 DIAGNOSIS — E088 Diabetes mellitus due to underlying condition with unspecified complications: Secondary | ICD-10-CM

## 2016-12-16 DIAGNOSIS — I10 Essential (primary) hypertension: Secondary | ICD-10-CM | POA: Insufficient documentation

## 2016-12-16 DIAGNOSIS — Z8679 Personal history of other diseases of the circulatory system: Secondary | ICD-10-CM

## 2016-12-16 NOTE — Progress Notes (Signed)
Cardiology Office Note:    Date:  12/16/2016   ID:  Jennifer Serrano, DOB Sep 11, 1923, MRN [id number]  PCP:  Pearline Cablesopland, Jessica C, MD  Cardiologist:  Garwin Brothersajan R Revankar, MD   Referring MD: Pearline Cablesopland, Jessica C, MD    ASSESSMENT:    1. Atrial arrhythmia   2. History of atrial fibrillation   3. Dyslipidemia   4. Diabetes mellitus due to underlying condition with complication, with long-term current use of insulin (HCC)    PLAN:    In order of problems listed above:  1. I discussed my findings with the patient at extensive length.I discussed with the patient atrial fibrillation, disease process. Management and therapy including rate and rhythm control, anticoagulation benefits and potential risks were discussed extensively with the patient. Patient had multiple questions which were answered to patient's satisfaction. 2. Patient is in sinus rhythm. EKG was done today and I reviewed this. Her blood pressure stable. Diabetes mellitus and lipids are followed by her primary care physician. Patient and daughter were advised about this. She will be seen in follow-up appointment in 6 months or earlier if she has any concerns.   Medication Adjustments/Labs and Tests Ordered: Current medicines are reviewed at length with the patient today.  Concerns regarding medicines are outlined above.  No orders of the defined types were placed in this encounter.  No orders of the defined types were placed in this encounter.    History of Present Illness:    Jennifer Serrano is a 83 y.o. female who is being seen today for the evaluation of Paroxysmal atrial fibrillation at the request of Copland, Gwenlyn FoundJessica C, MD. Patient is a pleasant 83 year old female. She appears younger than her stated age. She has history of essential hypertension, dyslipidemia and diabetes mellitus. She mostly lives in [place] and spent some time with her daughter. No chest pain orthopnea or PND. She's undergone atrial fibrillation  ablation. She denies any problems at this time and is active age appropriately. At the time of my evaluation she is alert awake oriented and in no distress. She is sent here for evaluation of atrial fibrillation. She is on anticoagulation. She is steady on her feet and has never had a fall.  Past Medical History:  Diagnosis Date  . Atrial arrhythmia 11/16/2016   Admitted in October 2017 with paroxysmal atrial fib and multifocal atrial tacycardia  . B12 deficiency 10/20/2016  . Controlled type 2 diabetes mellitus without complication, with long-term current use of insulin (HCC) 10/09/2016  . Diabetes mellitus without complication ([facility])   . Dyslipidemia 10/20/2016  . Heart murmur   . High cholesterol   . History of atrial fibrillation 10/09/2016  . Hypertension   . Vitamin D deficiency 10/20/2016    Past Surgical History:  Procedure Laterality Date  . ABLATION      Current Medications: Current Meds  Medication Sig  . apixaban (ELIQUIS) 5 MG TABS tablet Take 1 tablet (5 mg total) by mouth 2 (two) times daily.  . Cyanocobalamin ([employer] B-12) 1000 MCG/15ML LIQD Take 5,000 mcg by mouth daily.  Marland Kitchen. docusate sodium (COLACE) 100 MG capsule Take 100 mg by mouth 2 (two) times daily.  . feeding supplement, GLUCERNA SHAKE, (GLUCERNA SHAKE) LIQD Take 237 mLs by mouth daily.  Marland Kitchen. glipiZIDE (GLUCOTROL) 10 MG tablet Take 1 tablet (10 mg total) by mouth 2 (two) times daily before a meal.  . Insulin Glargine (LANTUS SOLOSTAR) 100 UNIT/ML Solostar Pen Inject 10 Units into the skin. Inject 10 units  subcutaneously in the morning and in the evening.  . insulin lispro (HUMALOG KWIKPEN) 100 UNIT/ML KiwkPen Inject into the skin. Adjust according to sliding scale before each meal.  . losartan (COZAAR) 50 MG tablet Take 1 tablet (50 mg total) by mouth daily.  . Magnesium 400 MG TABS Take 1 tablet by mouth daily.  . metoprolol tartrate (LOPRESSOR) 25 MG tablet Take 25 mg by mouth 2 (two) times daily.  . Multiple  Vitamins-Minerals (MULTI-VITAMIN GUMMIES PO) Take 2 tablets by mouth daily.  . rosuvastatin (CRESTOR) 10 MG tablet Take 10 mg by mouth daily.      Allergies:   Sulfa antibiotics   Social History   Social History  . Marital status: Married    Spouse name: N/A  . Number of children: N/A  . Years of education: N/A   Social History Main Topics  . Smoking status: Never Smoker  . Smokeless tobacco: Not on file  . Alcohol use No  . Drug use: Unknown  . Sexual activity: Not on file   Other Topics Concern  . Not on file   Social History Narrative  . No narrative on file     Family History: The patient's family history includes Breast cancer in her other.  ROS:   Please see the history of present illness.    All other systems reviewed and are negative.  EKGs/Labs/Other Studies Reviewed:    The following studies were reviewed today: I reviewed the records from the primary care physician and discuss this with the patient and her daughter accompanies.   Recent Labs: 06/10/2016: TSH 3.80 10/09/2016: ALT 45; BUN 23; Creatinine, Ser 1.04; Hemoglobin 12.5; Platelets 224.0; Potassium 5.1; Sodium 139  Recent Lipid Panel    Component Value Date/Time   CHOL 145 06/10/2016   TRIG 113 06/10/2016   HDL 54 06/10/2016   LDLCALC 68 06/10/2016    Physical Exam:    VS:  There were no vitals taken for this visit.    Wt Readings from Last 3 Encounters:  12/11/16 175 lb (79.4 kg)  10/09/16 171 lb 3.2 oz (77.7 kg)  01/18/14 163 lb (73.9 kg)     GEN: Patient is in no acute distress HEENT: Normal NECK: No JVD; No carotid bruits LYMPHATICS: No lymphadenopathy CARDIAC: S1 S2 regular, 2/6 systolic murmur at the apex. RESPIRATORY:  Clear to auscultation without rales, wheezing or rhonchi  ABDOMEN: Soft, non-tender, non-distended MUSCULOSKELETAL:  No edema; No deformity  SKIN: Warm and dry NEUROLOGIC:  Alert and oriented x 3 PSYCHIATRIC:  Normal affect    Signed, Garwin Brothers,  MD  12/16/2016 11:21 AM    [facility]

## 2016-12-16 NOTE — Patient Instructions (Signed)

## 2016-12-27 ENCOUNTER — Telehealth: Payer: Self-pay | Admitting: Family Medicine

## 2016-12-27 NOTE — Telephone Encounter (Signed)
Dropped off paperwork for FL2, placed in bind

## 2016-12-31 NOTE — Telephone Encounter (Signed)
Daughter Samara DeistKathryn  calling to check the status of the paperwork  Call back is  [phone]

## 2016-12-31 NOTE — Telephone Encounter (Signed)
Completed provider information; forwarded to provider [for return to office tomorrow]/SLS 08/07

## 2017-01-02 NOTE — Telephone Encounter (Signed)
Please advise daughter due to nurse, pcp and head nurse being offsite daughter has a question regarding patient insulin.

## 2017-01-02 NOTE — Telephone Encounter (Signed)
Sorry, I do not receive paperwork back from Dr. Patsy Lageropland after it is forwarded discussed via Skype with Tiffany]/SLS 08/09

## 2017-01-02 NOTE — Telephone Encounter (Signed)
Daughter checking on the status of FL2 form and would like to speak with someone today,please advise  Shepard,[phone]

## 2017-01-05 NOTE — Telephone Encounter (Signed)
I have spoken with her daughter and prepared FL2 form for her to pick up tomorrow

## 2017-01-06 NOTE — Telephone Encounter (Signed)
Copy of paperwork to scan; copy placed up front for pick-up/SLS 08/13

## 2017-02-12 ENCOUNTER — Other Ambulatory Visit: Payer: Medicare Other

## 2017-07-09 ENCOUNTER — Ambulatory Visit (INDEPENDENT_AMBULATORY_CARE_PROVIDER_SITE_OTHER): Payer: Medicare Other | Admitting: Cardiology

## 2017-07-09 ENCOUNTER — Encounter: Payer: Self-pay | Admitting: Cardiology

## 2017-07-09 ENCOUNTER — Other Ambulatory Visit: Payer: Self-pay

## 2017-07-09 VITALS — BP 140/70 | HR 81 | Ht 64.0 in | Wt 184.0 lb

## 2017-07-09 DIAGNOSIS — I10 Essential (primary) hypertension: Secondary | ICD-10-CM

## 2017-07-09 DIAGNOSIS — E785 Hyperlipidemia, unspecified: Secondary | ICD-10-CM

## 2017-07-09 DIAGNOSIS — E088 Diabetes mellitus due to underlying condition with unspecified complications: Secondary | ICD-10-CM

## 2017-07-09 DIAGNOSIS — Z8679 Personal history of other diseases of the circulatory system: Secondary | ICD-10-CM

## 2017-07-09 DIAGNOSIS — Z794 Long term (current) use of insulin: Secondary | ICD-10-CM | POA: Diagnosis not present

## 2017-07-09 NOTE — Progress Notes (Signed)
Cardiology Office Note:    Date:  07/09/2017   ID:  Jennifer Serrano, DOB [birth year], MRN [id number]  PCP:  Pearline Cables, MD  Cardiologist:  Garwin Brothers, MD   Referring MD: Pearline Cables, MD    ASSESSMENT:    1. History of atrial fibrillation   2. Essential hypertension   3. Dyslipidemia   4. Diabetes mellitus due to underlying condition with complication, with long-term current use of insulin (HCC)    PLAN:    In order of problems listed above:  1. I discussed with the patient atrial fibrillation, disease process. Management and therapy including rate and rhythm control, anticoagulation benefits and potential risks were discussed extensively with the patient. Patient had multiple questions which were answered to patient's satisfaction. 2. Her blood pressure is stable.  Diet was discussed for diabetes mellitus and she vocalized understanding. 3. Her gait is stable. 4. Patient will be seen in follow-up appointment in 6 months or earlier if the patient has any concerns    Medication Adjustments/Labs and Tests Ordered: Current medicines are reviewed at length with the patient today.  Concerns regarding medicines are outlined above.  Orders Placed This Encounter  Procedures  . EKG 12-Lead   No orders of the defined types were placed in this encounter.    Chief Complaint  Patient presents with  . Follow-up  . Atrial Fibrillation     History of Present Illness:    Jennifer Serrano is a 84 y.o. female.  Patient has paroxysmal atrial fibrillation.  She is on anticoagulation and tolerating it well.  She denies any problems at this time and lives in an assisted living facility.  No chest pain orthopnea or PND.  She ambulates age appropriately.  At the time of my evaluation, the patient is alert awake oriented and in no distress.  Past Medical History:  Diagnosis Date  . Atrial arrhythmia 11/16/2016   Admitted in October 2017 with paroxysmal atrial fib and  multifocal atrial tacycardia  . B12 deficiency 10/20/2016  . Controlled type 2 diabetes mellitus without complication, with long-term current use of insulin (HCC) 10/09/2016  . Diabetes mellitus without complication (HCC)   . Dyslipidemia 10/20/2016  . Heart murmur   . High cholesterol   . History of atrial fibrillation 10/09/2016  . Hypertension   . Vitamin D deficiency 10/20/2016    Past Surgical History:  Procedure Laterality Date  . ABLATION      Current Medications: Current Meds  Medication Sig  . apixaban (ELIQUIS) 5 MG TABS tablet Take 1 tablet (5 mg total) by mouth 2 (two) times daily.  . Cyanocobalamin (CVS B-12) 1000 MCG/15ML LIQD Take 5,000 mcg by mouth daily.  Marland Kitchen docusate sodium (COLACE) 100 MG capsule Take 100 mg by mouth 2 (two) times daily.  . feeding supplement, GLUCERNA SHAKE, (GLUCERNA SHAKE) LIQD Take 237 mLs by mouth daily.  Marland Kitchen glipiZIDE (GLUCOTROL) 10 MG tablet Take 1 tablet (10 mg total) by mouth 2 (two) times daily before a meal.  . Insulin Glargine (LANTUS SOLOSTAR) 100 UNIT/ML Solostar Pen Inject 10 Units into the skin. Inject 10 units subcutaneously in the morning and in the evening.  . insulin lispro (HUMALOG KWIKPEN) 100 UNIT/ML KiwkPen Inject into the skin. Adjust according to sliding scale before each meal.  . losartan (COZAAR) 50 MG tablet Take 1 tablet (50 mg total) by mouth daily.  . Magnesium 400 MG TABS Take 1 tablet by mouth daily.  . metoprolol tartrate (LOPRESSOR)  25 MG tablet Take 25 mg by mouth 2 (two) times daily.  . Multiple Vitamins-Minerals (MULTI-VITAMIN GUMMIES PO) Take 2 tablets by mouth daily.  . ondansetron (ZOFRAN) 4 MG tablet   . rosuvastatin (CRESTOR) 10 MG tablet Take 10 mg by mouth daily.   . valsartan (DIOVAN) 40 MG tablet      Allergies:   Sulfa antibiotics   Social History   Socioeconomic History  . Marital status: Married    Spouse name: None  . Number of children: None  . Years of education: None  . Highest education  level: None  Social Needs  . Financial resource strain: None  . Food insecurity - worry: None  . Food insecurity - inability: None  . Transportation needs - medical: None  . Transportation needs - non-medical: None  Occupational History  . None  Tobacco Use  . Smoking status: Never Smoker  . Smokeless tobacco: Never Used  Substance and Sexual Activity  . Alcohol use: No  . Drug use: None  . Sexual activity: None  Other Topics Concern  . None  Social History Narrative  . None     Family History: The patient's family history includes Breast cancer in her other.  ROS:   Please see the history of present illness.    All other systems reviewed and are negative.  EKGs/Labs/Other Studies Reviewed:    The following studies were reviewed today: I reviewed previous office visit records extensively and discussed with the patient.   Recent Labs: 10/09/2016: ALT 45; BUN 23; Creatinine, Ser 1.04; Hemoglobin 12.5; Platelets 224.0; Potassium 5.1; Sodium 139  Recent Lipid Panel    Component Value Date/Time   CHOL 145 06/10/2016   TRIG 113 06/10/2016   HDL 54 06/10/2016   LDLCALC 68 06/10/2016    Physical Exam:    VS:  BP 140/70 (BP Location: Right Arm, Patient Position: Sitting, Cuff Size: Normal)   Pulse 81   Ht 5\' 4"  (1.626 m)   Wt 184 lb (83.5 kg)   SpO2 97%   BMI 31.58 kg/m     Wt Readings from Last 3 Encounters:  07/09/17 184 lb (83.5 kg)  12/11/16 175 lb (79.4 kg)  10/09/16 171 lb 3.2 oz (77.7 kg)     GEN: Patient is in no acute distress HEENT: Normal NECK: No JVD; No carotid bruits LYMPHATICS: No lymphadenopathy CARDIAC: Hear sounds regular, 2/6 systolic murmur at the apex. RESPIRATORY:  Clear to auscultation without rales, wheezing or rhonchi  ABDOMEN: Soft, non-tender, non-distended MUSCULOSKELETAL:  No edema; No deformity  SKIN: Warm and dry NEUROLOGIC:  Alert and oriented x 3 PSYCHIATRIC:  Normal affect   Signed, Garwin Brothersajan R Alexandar Weisenberger, MD  07/09/2017  3:27 PM    [facility]

## 2017-07-09 NOTE — Patient Instructions (Signed)

## 2018-01-27 ENCOUNTER — Encounter: Payer: Self-pay | Admitting: Cardiology

## 2018-01-27 ENCOUNTER — Ambulatory Visit (INDEPENDENT_AMBULATORY_CARE_PROVIDER_SITE_OTHER): Payer: Medicare Other | Admitting: Cardiology

## 2018-01-27 VITALS — BP 126/72 | HR 89 | Ht 64.0 in | Wt 168.1 lb

## 2018-01-27 DIAGNOSIS — I480 Paroxysmal atrial fibrillation: Secondary | ICD-10-CM

## 2018-01-27 DIAGNOSIS — E088 Diabetes mellitus due to underlying condition with unspecified complications: Secondary | ICD-10-CM | POA: Diagnosis not present

## 2018-01-27 DIAGNOSIS — I10 Essential (primary) hypertension: Secondary | ICD-10-CM

## 2018-01-27 DIAGNOSIS — Z8679 Personal history of other diseases of the circulatory system: Secondary | ICD-10-CM

## 2018-01-27 DIAGNOSIS — E785 Hyperlipidemia, unspecified: Secondary | ICD-10-CM

## 2018-01-27 NOTE — Progress Notes (Signed)
Cardiology Office Note:    Date:  01/27/2018   ID:  Willaim Serrano, DOB [birth year], MRN [id number]  PCP:  Pearline Cables, MD  Cardiologist:  Garwin Brothers, MD   Referring MD: Pearline Cables, MD    ASSESSMENT:    1. Essential hypertension   2. 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 History of atrial fibrillation   4. Dyslipidemia    PLAN:    In order of problems listed above:  1. Primary prevention stressed with the patient.  Importance of compliance with diet and medication stressed and she vocalized understanding.  Her blood pressure is stable. 2. I discussed with the patient atrial fibrillation, disease process. Management and therapy including rate and rhythm control, anticoagulation benefits and potential risks were discussed extensively with the patient. Patient had multiple questions which were answered to patient's satisfaction. 3. The patient and daughter request blood work to be done including hemoglobin A1c.  In view of the fact the patient is elderly I have agreed to do her blood work today.  We will do liver lipid profile also in spite of the fact that she is not fasting. 4. 6 months follow-up or earlier if she has any concerns   Medication Adjustments/Labs and Tests Ordered: Current medicines are reviewed at length with the patient today.  Concerns regarding medicines are outlined above.  No orders of the defined types were placed in this encounter.  No orders of the defined types were placed in this encounter.    Chief Complaint  Patient presents with  . Follow-up    6 month follow up no chest pain or swelling      History of Present Illness:    Jennifer Serrano is a 84 y.o. female.  Patient has history of atrial fibrillation and is on anticoagulation.  She denies any problems at this time and takes care of activities of daily living.  No chest pain orthopnea or PND.   She is very comfortable and happy with her quality of life.  She lives in a facility.  Her daughter accompanies her for this visit.  At the time of my evaluation, the patient is alert awake oriented and in no distress.  Past Medical History:  Diagnosis Date  . Atrial arrhythmia 11/16/2016   Admitted in October 2017 with paroxysmal atrial fib and multifocal atrial tacycardia  . B12 deficiency 10/20/2016  . Controlled type 2 diabetes mellitus without complication, with long-term current use of insulin (HCC) 10/09/2016  . Diabetes mellitus without complication (HCC)   . Dyslipidemia 10/20/2016  . Heart murmur   . High cholesterol   . History of atrial fibrillation 10/09/2016  . Hypertension   . Vitamin D deficiency 10/20/2016    Past Surgical History:  Procedure Laterality Date  . ABLATION      Current Medications: Current Meds  Medication Sig  . apixaban (ELIQUIS) 5 MG TABS tablet Take 1 tablet (5 mg total) by mouth 2 (two) times daily.  . Cyanocobalamin ([employer] B-12) 1000 MCG/15ML LIQD Take 5,000 mcg by mouth daily.  Marland Kitchen docusate sodium (COLACE) 100 MG capsule Take 100 mg by mouth 2 (two) times daily.  . feeding supplement, GLUCERNA SHAKE, (GLUCERNA SHAKE) LIQD Take 237 mLs by mouth daily.  Marland Kitchen glipiZIDE (GLUCOTROL) 10 MG tablet Take 1 tablet (10 mg total) by mouth 2 (two) times daily before a meal.  . Insulin Glargine (LANTUS SOLOSTAR) 100 UNIT/ML  Solostar Pen Inject 10 Units into the skin. Inject 10 units subcutaneously in the morning and in the evening.  . insulin lispro (HUMALOG KWIKPEN) 100 UNIT/ML KiwkPen Inject into the skin. Adjust according to sliding scale before each meal.  . losartan (COZAAR) 50 MG tablet Take 1 tablet (50 mg total) by mouth daily.  . Magnesium 400 MG TABS Take 1 tablet by mouth daily.  . metoprolol tartrate (LOPRESSOR) 25 MG tablet Take 25 mg by mouth 2 (two) times daily.  . Multiple Vitamins-Minerals (MULTI-VITAMIN GUMMIES PO) Take 2 tablets by mouth daily.  .  ondansetron (ZOFRAN) 4 MG tablet   . ranitidine (ZANTAC) 150 MG tablet   . rosuvastatin (CRESTOR) 10 MG tablet Take 10 mg by mouth daily.   . sertraline (ZOLOFT) 25 MG tablet   . TRULICITY 0.75 MG/0.5ML SOPN   . valsartan (DIOVAN) 40 MG tablet      Allergies:   Sulfa antibiotics   Social History   Socioeconomic History  . Marital status: Married    Spouse name: Not on file  . Number of children: Not on file  . Years of education: Not on file  . Highest education level: Not on file  Occupational History  . Not on file  Social Needs  . Financial resource strain: Not on file  . Food insecurity:    Worry: Not on file    Inability: Not on file  . Transportation needs:    Medical: Not on file    Non-medical: Not on file  Tobacco Use  . Smoking status: Never Smoker  . Smokeless tobacco: Never Used  Substance and Sexual Activity  . Alcohol use: No  . Drug use: Not on file  . Sexual activity: Not on file  Lifestyle  . Physical activity:    Days per week: Not on file    Minutes per session: Not on file  . Stress: Not on file  Relationships  . Social connections:    Talks on phone: Not on file    Gets together: Not on file    Attends religious service: Not on file    Active member of club or organization: Not on file    Attends meetings of clubs or organizations: Not on file    Relationship status: Not on file  Other Topics Concern  . Not on file  Social History Narrative  . Not on file     Family History: The patient's family history includes Breast cancer in her other.  ROS:   Please see the history of present illness.    All other systems reviewed and are negative.  EKGs/Labs/Other Studies Reviewed:    The following studies were reviewed today: I discussed my findings with the patient at extensive length.   Recent Labs: No results found for requested labs within last 8760 hours.  Recent Lipid Panel    Component Value Date/Time   CHOL 145 06/10/2016    TRIG 113 06/10/2016   HDL 54 06/10/2016   LDLCALC 68 06/10/2016    Physical Exam:    VS:  BP 126/72   Pulse 89   Ht 5\' 4"  (1.626 m)   Wt 168 lb 1.9 oz (76.3 kg)   BMI 28.86 kg/m     Wt Readings from Last 3 Encounters:  01/27/18 168 lb 1.9 oz (76.3 kg)  07/09/17 184 lb (83.5 kg)  12/11/16 175 lb (79.4 kg)     GEN: Patient is in no acute distress HEENT: Normal NECK: No JVD;  No carotid bruits LYMPHATICS: No lymphadenopathy CARDIAC: Hear sounds regular, 2/6 systolic murmur at the apex. RESPIRATORY:  Clear to auscultation without rales, wheezing or rhonchi  ABDOMEN: Soft, non-tender, non-distended MUSCULOSKELETAL:  No edema; No deformity  SKIN: Warm and dry NEUROLOGIC:  Alert and oriented x 3 PSYCHIATRIC:  Normal affect   Signed, Garwin Brothers, MD  01/27/2018 11:04 AM    [facility]

## 2018-01-27 NOTE — Patient Instructions (Signed)
Medication Instructions:  Your physician recommends that you continue on your current medications as directed. Please refer to the Current Medication list given to you today.  Labwork: Your physician recommends that you have the following labs drawn: CBC, BMPl, HgbA1c, liver and lipid panel.  Testing/Procedures: None  Follow-Up: Your physician recommends that you schedule a follow-up appointment in: 6 months  Any Other Special Instructions Will Be Listed Below (If Applicable).     If you need a refill on your cardiac medications before your next appointment, please call your pharmacy.   [facility]  Garey Ham, RN, BSN

## 2018-01-28 ENCOUNTER — Other Ambulatory Visit: Payer: Self-pay

## 2018-01-28 LAB — LIPID PANEL
CHOLESTEROL TOTAL: 246 mg/dL — AB (ref 100–199)
Chol/HDL Ratio: 5.9 ratio — ABNORMAL HIGH (ref 0.0–4.4)
HDL: 42 mg/dL (ref >39)
LDL Calculated: 148 mg/dL — ABNORMAL HIGH (ref 0–99)
TRIGLYCERIDES: 278 mg/dL — AB (ref 0–149)
VLDL Cholesterol Cal: 56 mg/dL — ABNORMAL HIGH (ref 5–40)

## 2018-01-28 LAB — HEPATIC FUNCTION PANEL
ALT: 16 IU/L (ref 0–32)
AST: 18 IU/L (ref 0–40)
Albumin: 3.9 g/dL (ref 3.2–4.6)
Alkaline Phosphatase: 71 IU/L (ref 39–117)
BILIRUBIN TOTAL: 0.3 mg/dL (ref 0.0–1.2)
Bilirubin, Direct: 0.1 mg/dL (ref 0.00–0.40)
Total Protein: 6.8 g/dL (ref 6.0–8.5)

## 2018-01-28 LAB — CBC WITH DIFFERENTIAL/PLATELET
Basophils Absolute: 0 10*3/uL (ref 0.0–0.2)
Basos: 1 %
EOS (ABSOLUTE): 0 10*3/uL (ref 0.0–0.4)
Eos: 1 %
Hematocrit: 38.1 % (ref 34.0–46.6)
Hemoglobin: 12.9 g/dL (ref 11.1–15.9)
IMMATURE GRANULOCYTES: 1 %
Immature Grans (Abs): 0 10*3/uL (ref 0.0–0.1)
Lymphocytes Absolute: 1 10*3/uL (ref 0.7–3.1)
Lymphs: 19 %
MCH: 32.1 pg (ref 26.6–33.0)
MCHC: 33.9 g/dL (ref 31.5–35.7)
MCV: 95 fL (ref 79–97)
MONOS ABS: 0.4 10*3/uL (ref 0.1–0.9)
Monocytes: 8 %
Neutrophils Absolute: 3.7 10*3/uL (ref 1.4–7.0)
Neutrophils: 70 %
PLATELETS: 241 10*3/uL (ref 150–450)
RBC: 4.02 x10E6/uL (ref 3.77–5.28)
RDW: 12 % — AB (ref 12.3–15.4)
WBC: 5.3 10*3/uL (ref 3.4–10.8)

## 2018-01-28 LAB — BASIC METABOLIC PANEL
BUN/Creatinine Ratio: 15 (ref 12–28)
BUN: 16 mg/dL (ref 10–36)
CO2: 22 mmol/L (ref 20–29)
Calcium: 10.8 mg/dL — ABNORMAL HIGH (ref 8.7–10.3)
Chloride: 101 mmol/L (ref 96–106)
Creatinine, Ser: 1.07 mg/dL — ABNORMAL HIGH (ref 0.57–1.00)
GFR calc Af Amer: 52 mL/min/{1.73_m2} — ABNORMAL LOW (ref >59)
GFR, EST NON AFRICAN AMERICAN: 45 mL/min/{1.73_m2} — AB (ref >59)
GLUCOSE: 207 mg/dL — AB (ref 65–99)
POTASSIUM: 4.6 mmol/L (ref 3.5–5.2)
SODIUM: 136 mmol/L (ref 134–144)

## 2018-01-28 LAB — HEMOGLOBIN A1C
Est. average glucose Bld gHb Est-mCnc: 212 mg/dL
Hgb A1c MFr Bld: 9 % — ABNORMAL HIGH (ref 4.8–5.6)

## 2018-01-28 MED ORDER — ROSUVASTATIN CALCIUM 20 MG PO TABS: 20.0000 mg | ORAL_TABLET | Freq: Every day | ORAL | 2 refills | Status: DC

## 2018-01-28 NOTE — Progress Notes (Signed)
Please give her a call and get her scheduled to see me for a diabetes recheck in the next few weeks- not an emergency.  Thank you!

## 2018-01-29 ENCOUNTER — Telehealth: Payer: Self-pay

## 2018-01-29 NOTE — Telephone Encounter (Signed)
Tried calling patient. Left message to call back to schedule and appointment.

## 2018-01-29 NOTE — Telephone Encounter (Signed)
-----   Message from Pearline Cables, MD sent at 01/28/2018  7:44 PM EDT -----   ----- Message ----- From: Craige Cotta, RN Sent: 01/28/2018   3:33 PM EDT To: Pearline Cables, MD

## 2018-02-02 MED ORDER — ROSUVASTATIN CALCIUM 20 MG PO TABS: 20.0000 mg | ORAL_TABLET | Freq: Every day | ORAL | 2 refills | Status: AC

## 2018-02-02 NOTE — Addendum Note (Signed)
Addended by: Craige Cotta on: 02/02/2018 10:35 AM   Modules accepted: Orders

## 2020-07-25 DEATH — deceased
